# Patient Record
Sex: Female | Born: 1970 | Race: White | Hispanic: No | Marital: Married
Health system: Southern US, Community
[De-identification: ages and names within clinical notes are randomized; demographics above are authoritative.]

---

## 2012-10-20 ENCOUNTER — Other Ambulatory Visit: Payer: Self-pay | Admitting: Family Medicine

## 2012-10-20 DIAGNOSIS — R200 Anesthesia of skin: Secondary | ICD-10-CM

## 2012-10-20 DIAGNOSIS — M542 Cervicalgia: Secondary | ICD-10-CM

## 2012-10-29 ENCOUNTER — Ambulatory Visit
Admission: RE | Admit: 2012-10-29 | Discharge: 2012-10-29 | Disposition: A | Discharge status: Home / Self Care | Payer: No Typology Code available for payment source | Source: Ambulatory Visit | Attending: Family Medicine | Admitting: Family Medicine

## 2012-10-29 DIAGNOSIS — M542 Cervicalgia: Secondary | ICD-10-CM

## 2012-10-29 DIAGNOSIS — R200 Anesthesia of skin: Secondary | ICD-10-CM

## 2013-06-13 ENCOUNTER — Other Ambulatory Visit (HOSPITAL_COMMUNITY)
Admission: RE | Admit: 2013-06-13 | Discharge: 2013-06-13 | Disposition: A | Discharge status: Home / Self Care | Payer: BC Managed Care – PPO | Source: Ambulatory Visit | Attending: Family Medicine | Admitting: Family Medicine

## 2013-06-13 ENCOUNTER — Other Ambulatory Visit: Payer: Self-pay | Admitting: Family Medicine

## 2013-06-13 DIAGNOSIS — Z0000 Encounter for general adult medical examination without abnormal findings: Secondary | ICD-10-CM | POA: Insufficient documentation

## 2013-06-15 ENCOUNTER — Other Ambulatory Visit: Payer: Self-pay | Admitting: Family Medicine

## 2013-06-15 DIAGNOSIS — Z129 Encounter for screening for malignant neoplasm, site unspecified: Secondary | ICD-10-CM

## 2013-06-18 LAB — CYTOLOGY - PAP

## 2013-06-19 ENCOUNTER — Other Ambulatory Visit: Payer: Self-pay | Admitting: Family Medicine

## 2013-06-19 DIAGNOSIS — Z8059 Family history of malignant neoplasm of other urinary tract organ: Secondary | ICD-10-CM

## 2013-06-20 ENCOUNTER — Ambulatory Visit
Admission: RE | Admit: 2013-06-20 | Discharge: 2013-06-20 | Disposition: A | Discharge status: Home / Self Care | Payer: BC Managed Care – PPO | Source: Ambulatory Visit | Attending: Family Medicine | Admitting: Family Medicine

## 2013-06-20 DIAGNOSIS — Z8059 Family history of malignant neoplasm of other urinary tract organ: Secondary | ICD-10-CM

## 2013-06-20 MED ORDER — IOHEXOL 300 MG/ML  SOLN
125.0000 mL | Freq: Once | INTRAMUSCULAR | Status: AC | PRN
Start: 2013-06-20 — End: 2013-06-20
  Administered 2013-06-20: 125 mL via INTRAVENOUS

## 2015-05-14 IMAGING — CT CT ABDOMEN WO/W CM
3 of 8 series · 16 of 36 positions shown, 19 images · IV contrast (WATER & [ID] OMNI 300)
Comparison: None.

CLINICAL DATA: Low back pain with strong family history of renal
cell carcinoma. No personal history of malignancy.

EXAM:
CT ABDOMEN WITHOUT AND WITH CONTRAST
TECHNIQUE: Multidetector CT imaging of the abdomen was performed following the
standard protocol before and following the bolus administration of
intravenous contrast.
CONTRAST:  125mL OMNIPAQUE IOHEXOL 300 MG/ML  SOLN

[Series 5: abdomen with · axial · 0.94mm/px · z∈[-170,-65]mm · 2 of 63 slices shown, 5 images]
[im 21/63  soft-tissue]
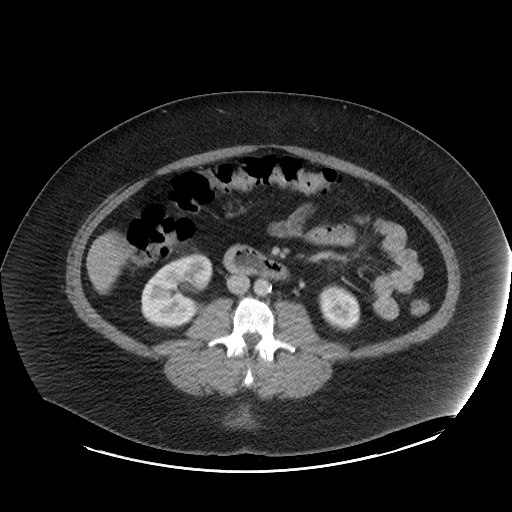
[im 21/63  lung]
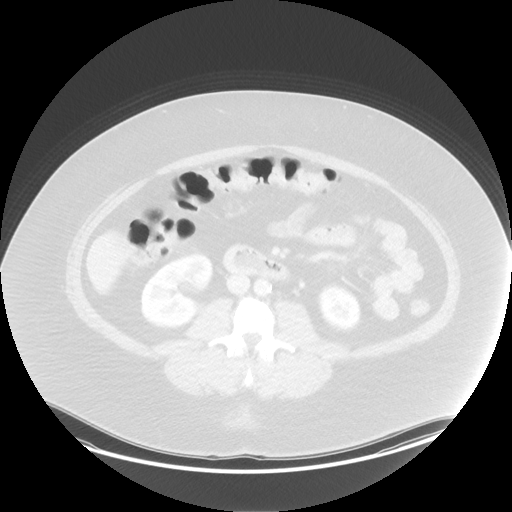
[im 21/63  bone]
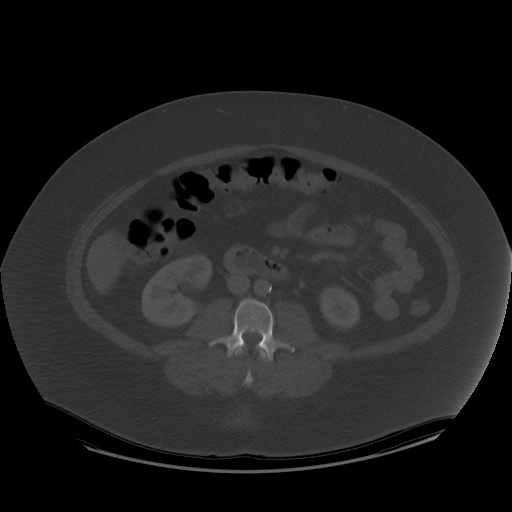
[im 42/63  soft-tissue]
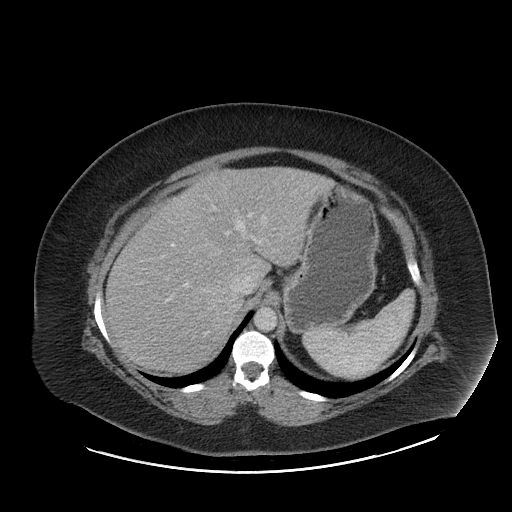
[im 42/63  lung]
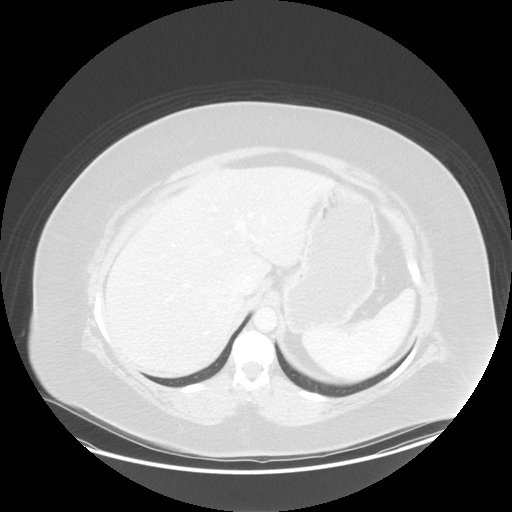

[Series 602: sagittal body · sagittal · 0.94mm/px · 6 of 193 slices shown (1 of 2)]
[im 20/193  soft-tissue]
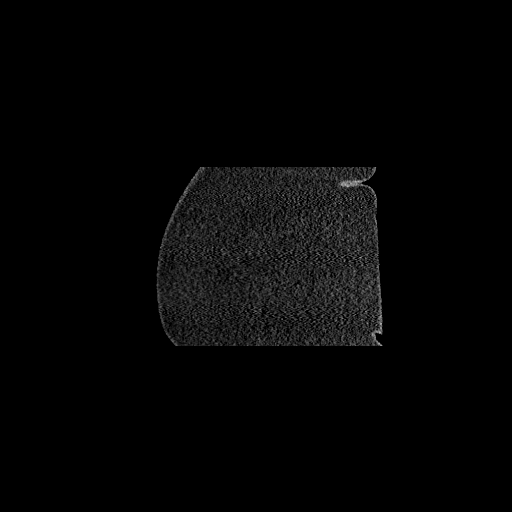
[im 39/193  soft-tissue]
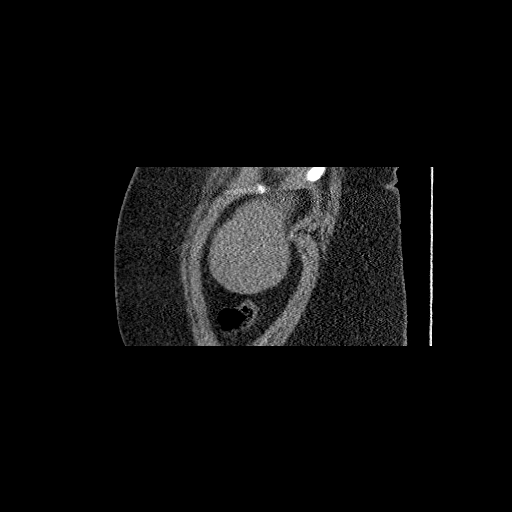
[im 58/193  soft-tissue]
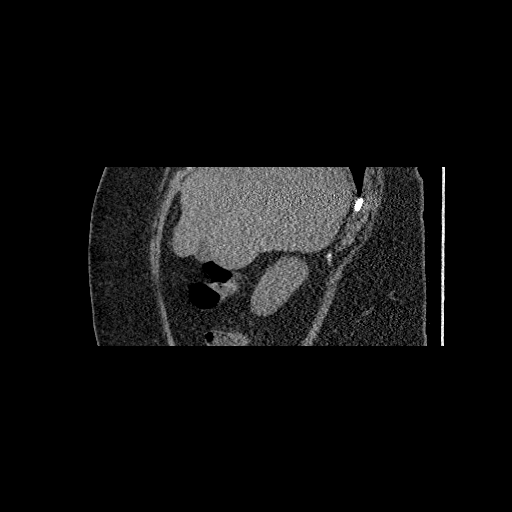
[im 77/193  soft-tissue]
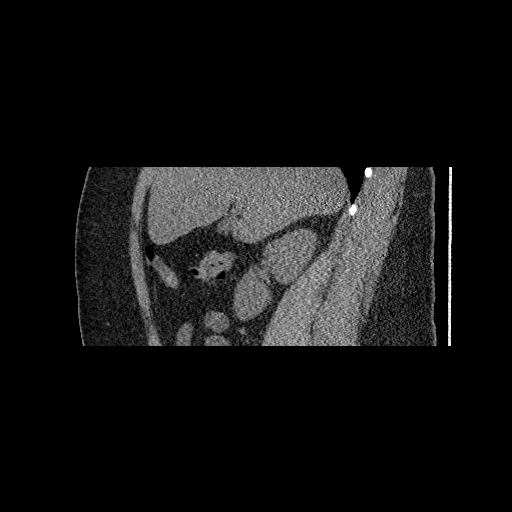
[im 116/193  soft-tissue]
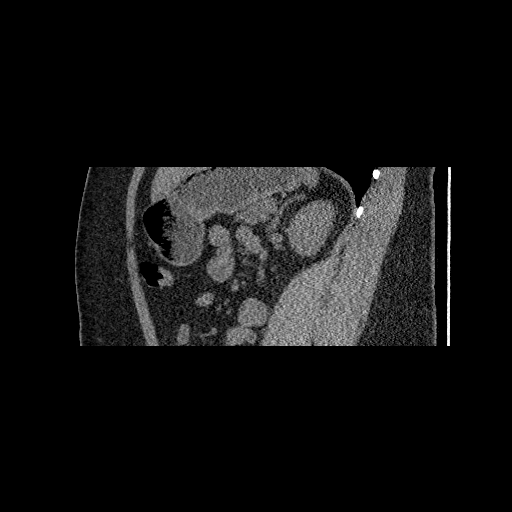
[im 135/193  soft-tissue]
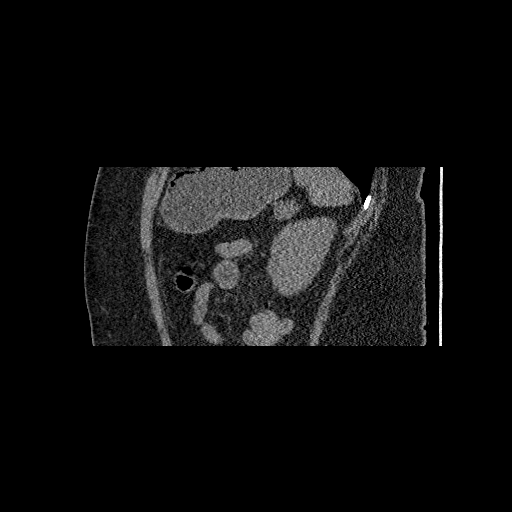

[Series 605: sagittal body · sagittal · 0.94mm/px · 8 of 193 slices shown (2 of 2)]
[im 20/193  soft-tissue]
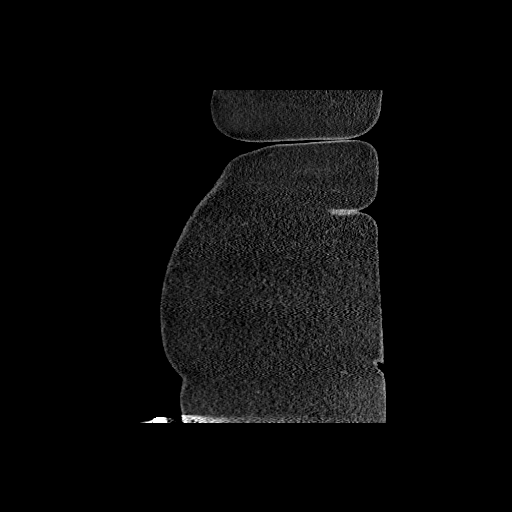
[im 39/193  soft-tissue]
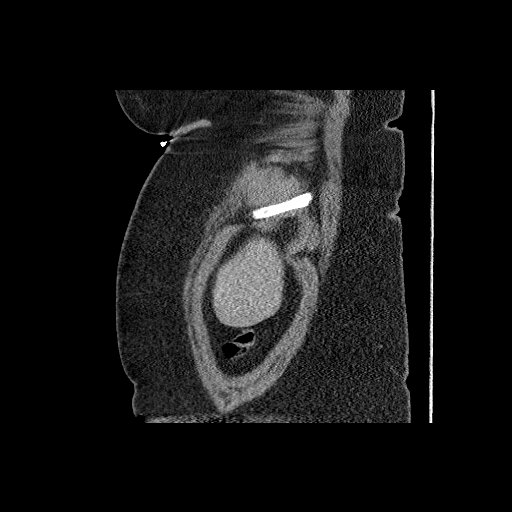
[im 58/193  soft-tissue]
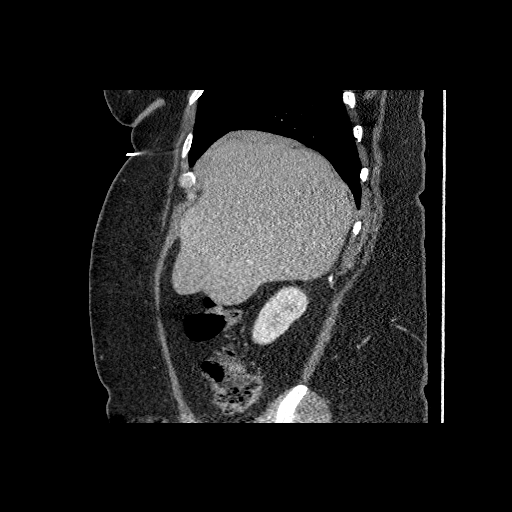
[im 77/193  soft-tissue]
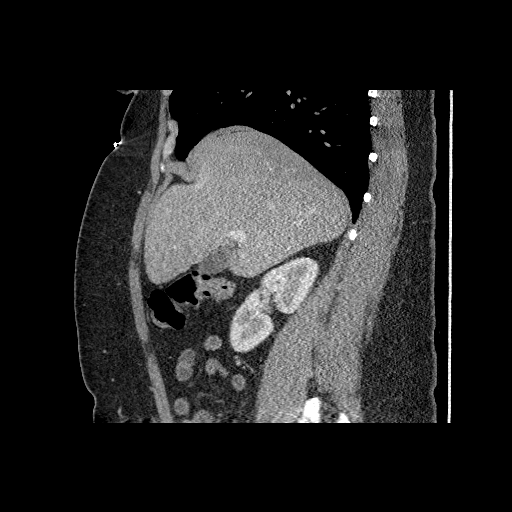
[im 116/193  soft-tissue]
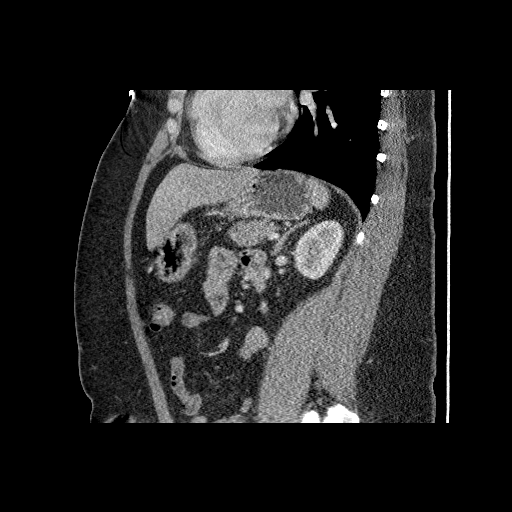
[im 135/193  soft-tissue]
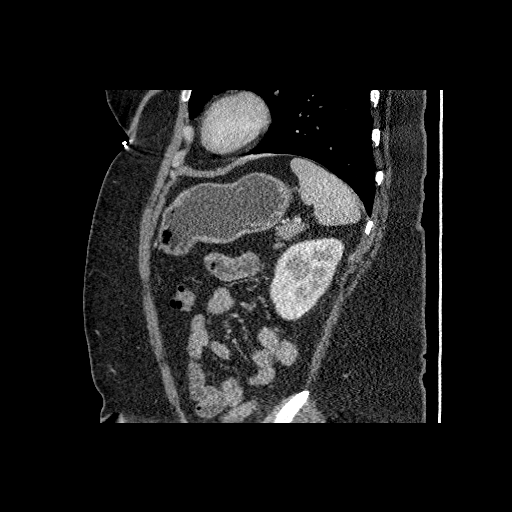
[im 154/193  soft-tissue]
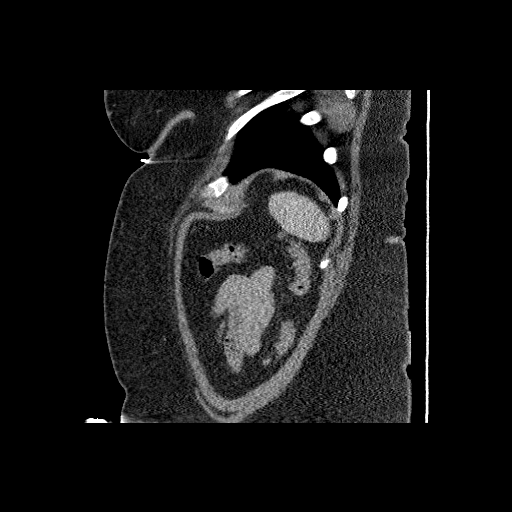
[im 173/193  soft-tissue]
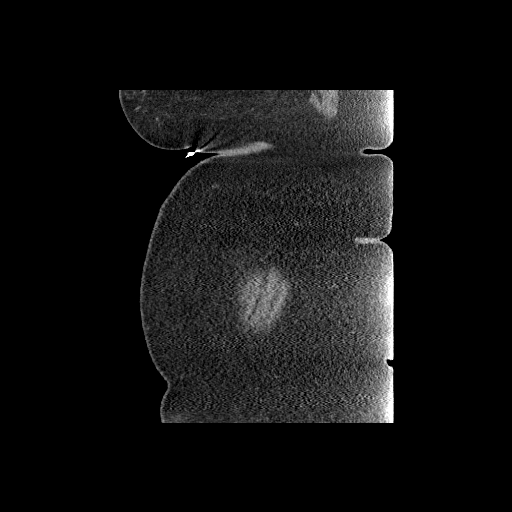

[16 of 36 positions shown; findings below may reference images not displayed]

FINDINGS: Lung bases: Clear.  There is no pleural or pericardial effusion.

Kidneys / Ureters / Bladder: Pre contrast images demonstrate no
renal or proximal ureteral calculi. Post-contrast, both kidneys
enhance normally. There is no evidence of renal mass. Delayed images
results in segmental visualization of the proximal ureters. No
urothelial abnormalities are identified. The bladder was not imaged.

Other: There is no adrenal mass. The liver demonstrates diffuse
steatosis but no focal abnormality. The gallbladder, pancreas and
spleen appear unremarkable.

No significant vascular abnormalities or enlarged lymph nodes are
identified. There is mild aortic and left common iliac
atherosclerosis. The stomach and visualized portions of the small
bowel, colon and appendix appear normal.

Bones / Musculoskeletal: There are no worrisome osseous findings.
Facet disease is noted in the lower lumbar spine.
IMPRESSION: 1. No evidence of renal cell carcinoma.
2. Both kidneys appear normal. There is no evidence of urinary tract
calculus or urothelial lesion.
3. Hepatic steatosis.
